# Patient Record
Sex: Female | Born: 1990 | Race: White | Hispanic: No | Marital: Married | Smoking: Never smoker
Health system: Southern US, Community
[De-identification: ages and names within clinical notes are randomized; demographics above are authoritative.]

## PROBLEM LIST (undated history)

## (undated) HISTORY — PX: ABDOMINAL SURGERY: SHX537

---

## 2020-12-08 ENCOUNTER — Other Ambulatory Visit: Payer: 59

## 2020-12-08 ENCOUNTER — Other Ambulatory Visit: Payer: Self-pay

## 2020-12-08 DIAGNOSIS — Z20822 Contact with and (suspected) exposure to covid-19: Secondary | ICD-10-CM

## 2020-12-11 LAB — NOVEL CORONAVIRUS, NAA: SARS-CoV-2, NAA: DETECTED — AB

## 2020-12-22 ENCOUNTER — Other Ambulatory Visit: Payer: Self-pay | Admitting: Physician Assistant

## 2020-12-22 ENCOUNTER — Other Ambulatory Visit (HOSPITAL_COMMUNITY): Payer: Self-pay

## 2020-12-22 ENCOUNTER — Other Ambulatory Visit (HOSPITAL_COMMUNITY): Payer: Self-pay | Admitting: Physician Assistant

## 2020-12-22 DIAGNOSIS — N946 Dysmenorrhea, unspecified: Secondary | ICD-10-CM

## 2020-12-22 DIAGNOSIS — N939 Abnormal uterine and vaginal bleeding, unspecified: Secondary | ICD-10-CM

## 2020-12-22 DIAGNOSIS — N979 Female infertility, unspecified: Secondary | ICD-10-CM

## 2020-12-22 DIAGNOSIS — E289 Ovarian dysfunction, unspecified: Secondary | ICD-10-CM

## 2020-12-25 ENCOUNTER — Ambulatory Visit
Admission: RE | Admit: 2020-12-25 | Discharge: 2020-12-25 | Disposition: A | Discharge status: Home / Self Care | Payer: 59 | Source: Ambulatory Visit | Attending: Pulmonary Disease | Admitting: Pulmonary Disease

## 2020-12-25 ENCOUNTER — Other Ambulatory Visit: Payer: Self-pay

## 2020-12-25 DIAGNOSIS — N939 Abnormal uterine and vaginal bleeding, unspecified: Secondary | ICD-10-CM | POA: Diagnosis present

## 2020-12-25 DIAGNOSIS — E289 Ovarian dysfunction, unspecified: Secondary | ICD-10-CM

## 2020-12-25 DIAGNOSIS — N979 Female infertility, unspecified: Secondary | ICD-10-CM | POA: Diagnosis present

## 2020-12-25 DIAGNOSIS — N946 Dysmenorrhea, unspecified: Secondary | ICD-10-CM | POA: Diagnosis present

## 2021-11-27 IMAGING — US US PELVIS COMPLETE WITH TRANSVAGINAL
1 series · 14 of 25 positions shown · non-contrast
Comparison: None

CLINICAL DATA: Menorrhagia, abnormal vaginal bleeding, ovarian
dysfunction infertility, LMP 12/18/2020

EXAM:
TRANSABDOMINAL AND TRANSVAGINAL ULTRASOUND OF PELVIS
TECHNIQUE: Both transabdominal and transvaginal ultrasound examinations of the
pelvis were performed. Transabdominal technique was performed for
global imaging of the pelvis including uterus, ovaries, adnexal
regions, and pelvic cul-de-sac. It was necessary to proceed with
endovaginal exam following the transabdominal exam to visualize the
endometrium and RIGHT ovary.

[Series 1: us pelvis complete with transvaginal · 0.27mm/px · 94 acquisitions, 14 frames shown]
[im 1/94]
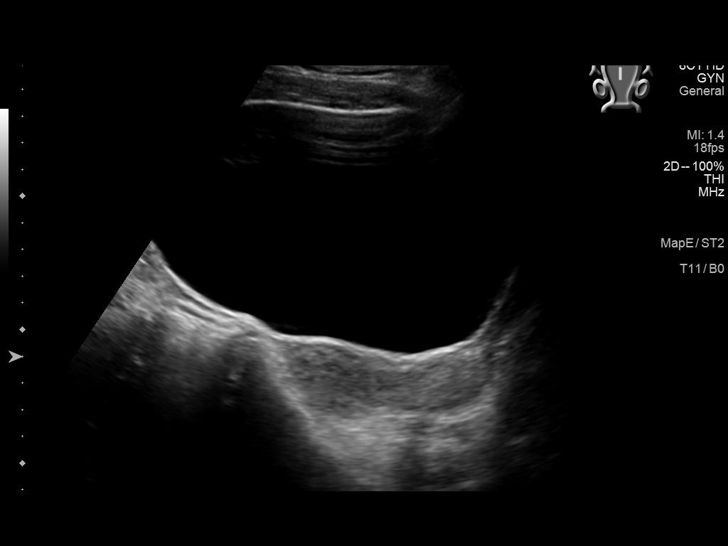
[im 8/94]
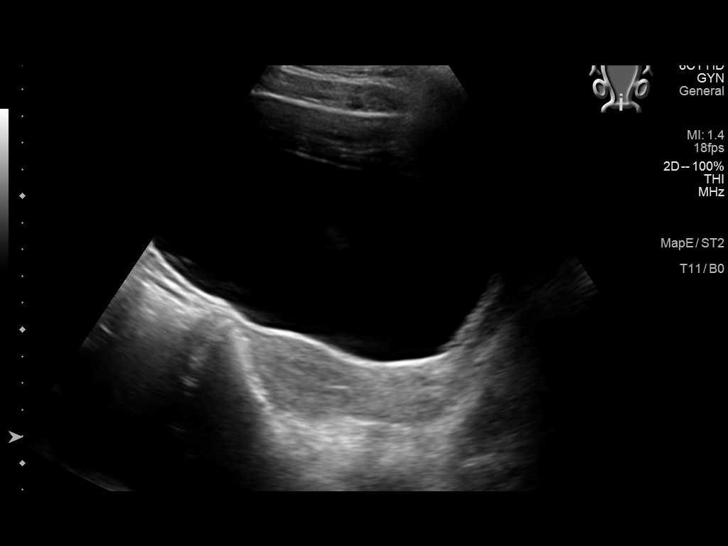
[im 16/94]
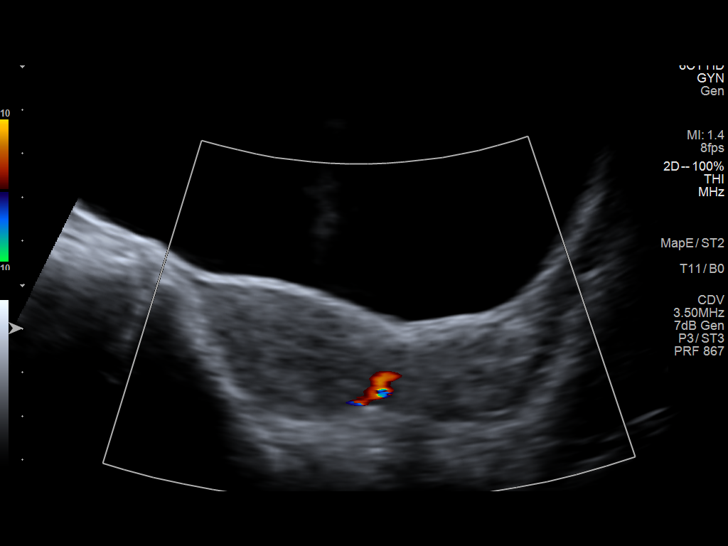
[im 24/94]
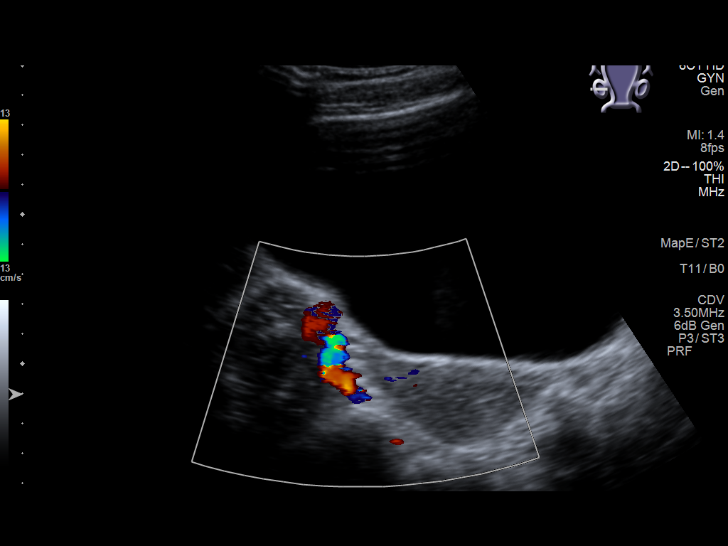
[im 32/94]
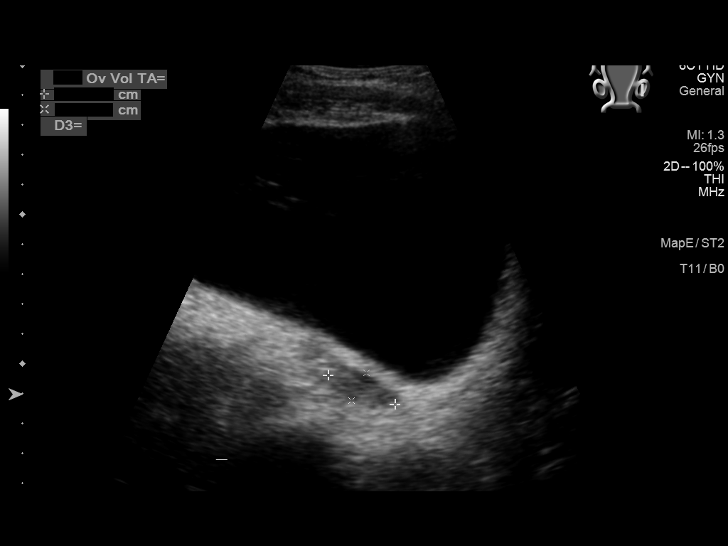
[im 35/94]
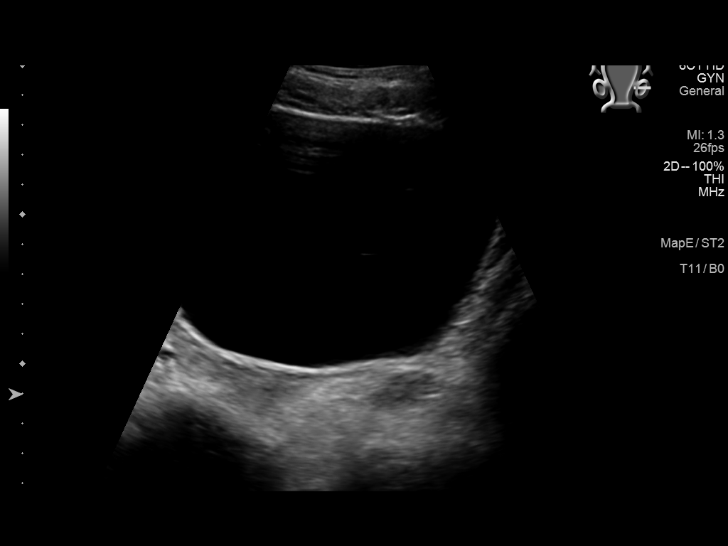
[im 43/94]
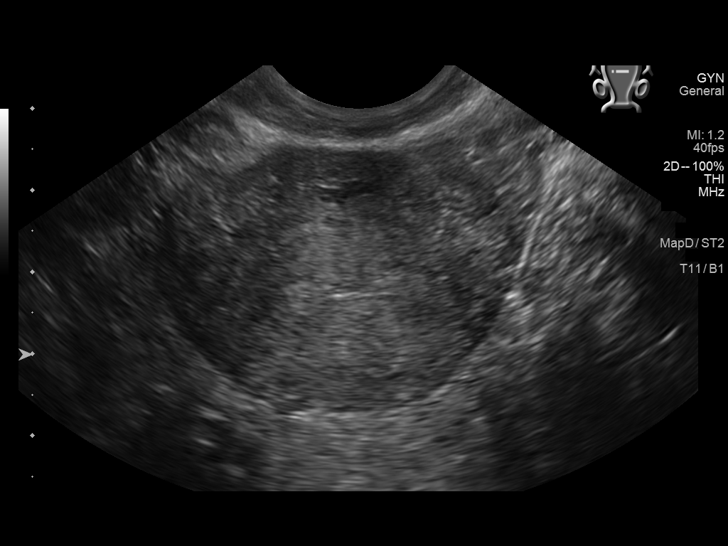
[im 51/94]
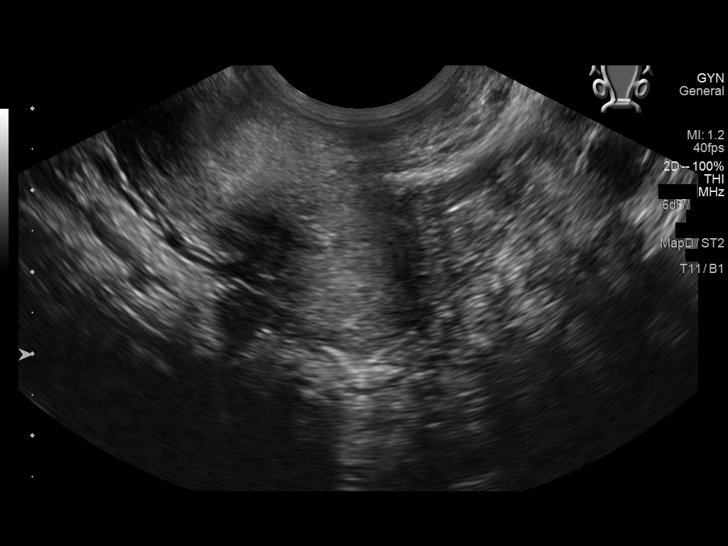
[im 59/94]
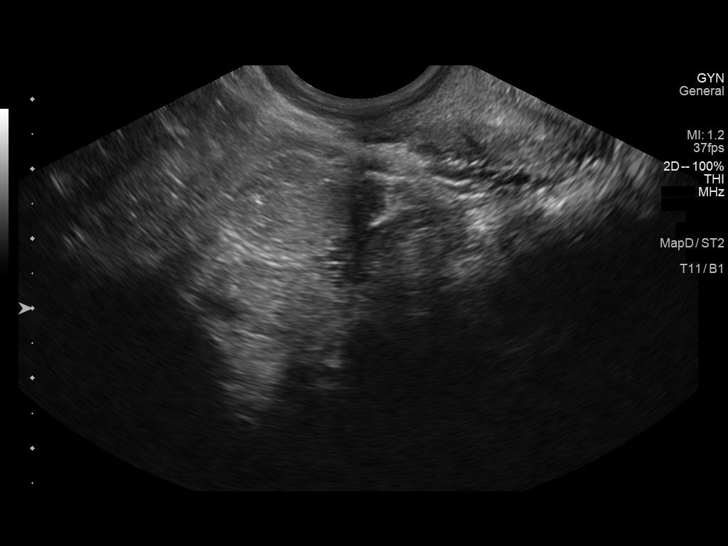
[im 63/94]
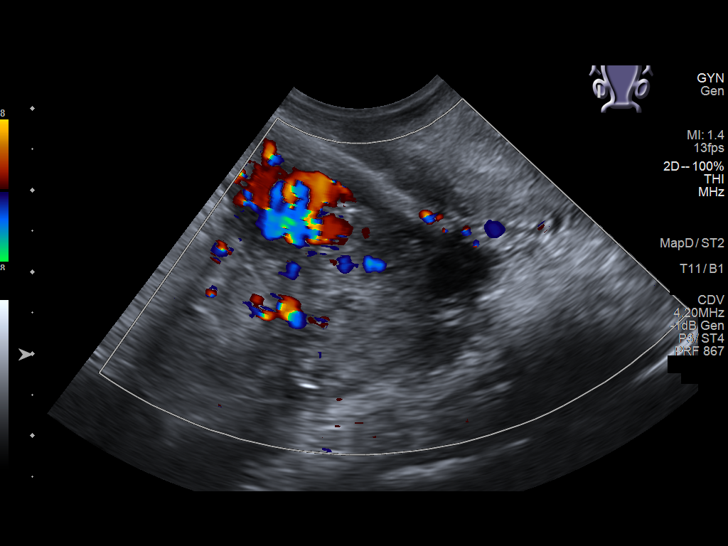
[im 70/94]
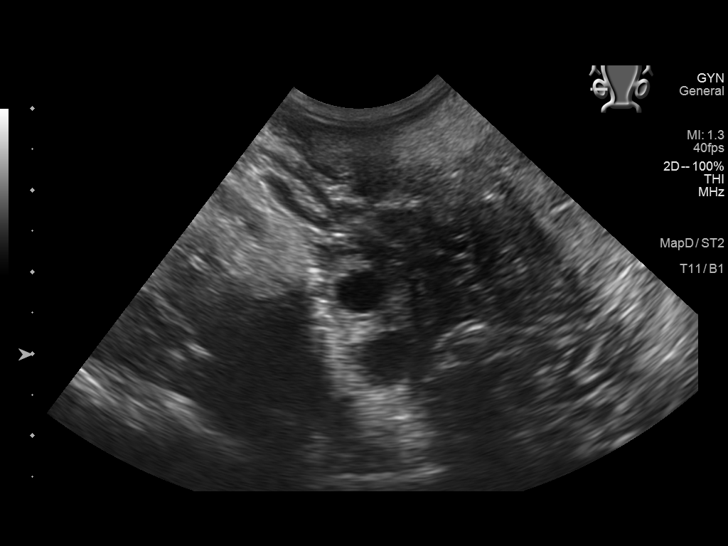
[im 78/94]
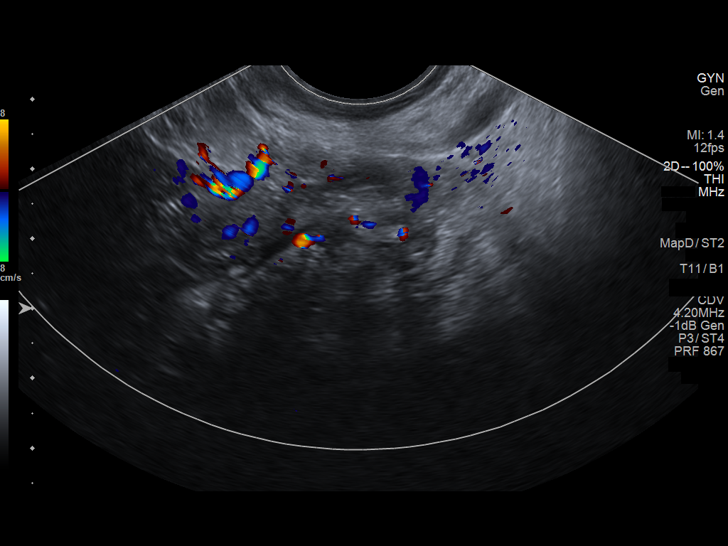
[im 86/94]
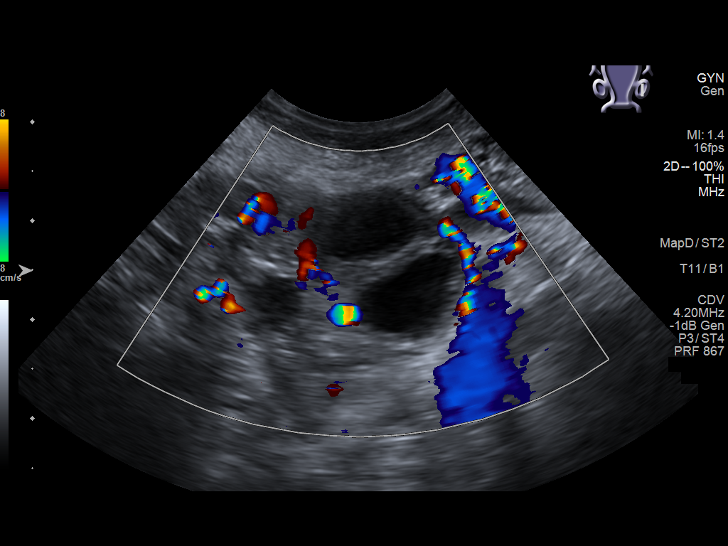
[im 94/94]
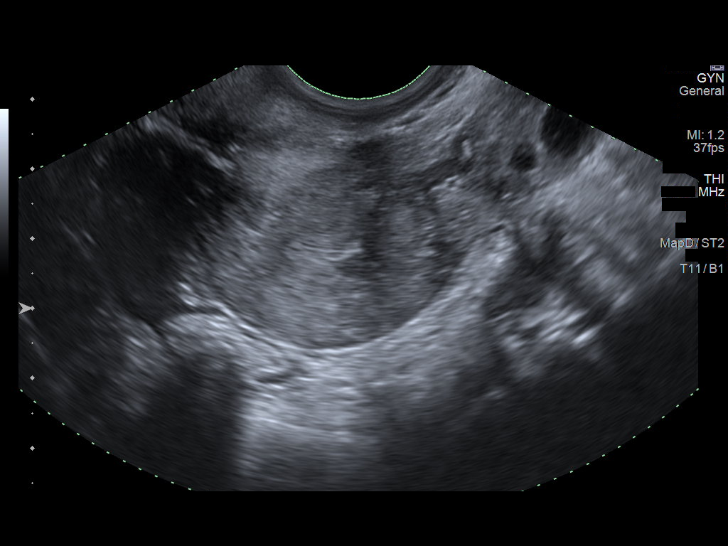

[14 of 25 positions shown; findings below may reference images not displayed]

FINDINGS: Uterus

Measurements: 7.6 x 2.9 x 4.4 cm = volume: 50 mL. Anteverted. Normal
morphology without mass

Endometrium

Thickness: 6 mm.  No endometrial fluid or focal abnormality

Right ovary

Measurements: 2.8 x 1.9 x 2.0 cm = volume: 5.6 mL. Normal morphology
without mass

Left ovary

Measurements: 2.5 x 1.8 x 1.7 cm = volume: 4.0 mL. Normal morphology
without mass

Other findings

No free pelvic fluid.  No adnexal masses.
IMPRESSION: Normal exam.

## 2022-01-19 ENCOUNTER — Encounter: Payer: Self-pay | Admitting: Emergency Medicine

## 2022-01-19 ENCOUNTER — Ambulatory Visit
Admission: EM | Admit: 2022-01-19 | Discharge: 2022-01-19 | Disposition: A | Discharge status: Home / Self Care | Payer: 59 | Attending: Emergency Medicine | Admitting: Emergency Medicine

## 2022-01-19 ENCOUNTER — Other Ambulatory Visit: Payer: Self-pay

## 2022-01-19 DIAGNOSIS — M5431 Sciatica, right side: Secondary | ICD-10-CM

## 2022-01-19 DIAGNOSIS — M549 Dorsalgia, unspecified: Secondary | ICD-10-CM | POA: Diagnosis not present

## 2022-01-19 DIAGNOSIS — R1013 Epigastric pain: Secondary | ICD-10-CM

## 2022-01-19 DIAGNOSIS — R197 Diarrhea, unspecified: Secondary | ICD-10-CM | POA: Diagnosis not present

## 2022-01-19 DIAGNOSIS — R140 Abdominal distension (gaseous): Secondary | ICD-10-CM | POA: Insufficient documentation

## 2022-01-19 LAB — CBC WITH DIFFERENTIAL/PLATELET
Abs Immature Granulocytes: 0.01 10*3/uL (ref 0.00–0.07)
Basophils Absolute: 0.1 10*3/uL (ref 0.0–0.1)
Basophils Relative: 1 %
Eosinophils Absolute: 1.6 10*3/uL — ABNORMAL HIGH (ref 0.0–0.5)
Eosinophils Relative: 15 %
HCT: 40.2 % (ref 36.0–46.0)
Hemoglobin: 13.8 g/dL (ref 12.0–15.0)
Immature Granulocytes: 0 %
Lymphocytes Relative: 27 %
Lymphs Abs: 2.8 10*3/uL (ref 0.7–4.0)
MCH: 30.5 pg (ref 26.0–34.0)
MCHC: 34.3 g/dL (ref 30.0–36.0)
MCV: 88.7 fL (ref 80.0–100.0)
Monocytes Absolute: 0.6 10*3/uL (ref 0.1–1.0)
Monocytes Relative: 6 %
Neutro Abs: 5.6 10*3/uL (ref 1.7–7.7)
Neutrophils Relative %: 51 %
Platelets: 255 10*3/uL (ref 150–400)
RBC: 4.53 MIL/uL (ref 3.87–5.11)
RDW: 11.8 % (ref 11.5–15.5)
WBC: 10.6 10*3/uL — ABNORMAL HIGH (ref 4.0–10.5)
nRBC: 0 % (ref 0.0–0.2)

## 2022-01-19 LAB — COMPREHENSIVE METABOLIC PANEL
ALT: 20 U/L (ref 0–44)
AST: 27 U/L (ref 15–41)
Albumin: 4.3 g/dL (ref 3.5–5.0)
Alkaline Phosphatase: 57 U/L (ref 38–126)
Anion gap: 7 (ref 5–15)
BUN: 11 mg/dL (ref 6–20)
CO2: 26 mmol/L (ref 22–32)
Calcium: 9 mg/dL (ref 8.9–10.3)
Chloride: 101 mmol/L (ref 98–111)
Creatinine, Ser: 0.63 mg/dL (ref 0.44–1.00)
GFR, Estimated: >60 mL/min (ref >60)
Glucose, Bld: 85 mg/dL (ref 70–99)
Potassium: 4.2 mmol/L (ref 3.5–5.1)
Sodium: 134 mmol/L — ABNORMAL LOW (ref 135–145)
Total Bilirubin: 1 mg/dL (ref 0.3–1.2)
Total Protein: 7.1 g/dL (ref 6.5–8.1)

## 2022-01-19 LAB — LIPASE, BLOOD: Lipase: 34 U/L (ref 11–51)

## 2022-01-19 MED ORDER — LIDOCAINE VISCOUS HCL 2 % MT SOLN
15.0000 mL | Freq: Once | OROMUCOSAL | Status: AC
  Administered 2022-01-19: 15 mL via ORAL

## 2022-01-19 MED ORDER — OMEPRAZOLE 20 MG PO CPDR: 20.0000 mg | DELAYED_RELEASE_CAPSULE | Freq: Every day | ORAL | 2 refills | Status: AC

## 2022-01-19 MED ORDER — ALUM & MAG HYDROXIDE-SIMETH 200-200-20 MG/5ML PO SUSP
30.0000 mL | Freq: Once | ORAL | Status: AC
  Administered 2022-01-19: 30 mL via ORAL

## 2022-01-19 MED ORDER — METHYLPREDNISOLONE 4 MG PO TBPK: ORAL_TABLET | ORAL | 0 refills | Status: AC

## 2022-01-19 NOTE — ED Provider Notes (Signed)
[facility]    CSN: [id number] Arrival date & time: 01/19/22  1146      History   Chief Complaint Chief Complaint  Patient presents with   Abdominal Pain    HPI Chelsea Foley is a 31 y.o. female.   HPI  31 year old female here for evaluation of abdominal pain.  Patient reports that she has been experiencing epigastric abdominal pain for the last 4 to 5 weeks that is associated with pressure in her abdomen that she describes as a feeling of a balloon inflating and deflating that radiates through to her back.  She also reports that she has been having diarrhea stools for the last 4 weeks as well.  The pain has increased over the last 2 weeks and more significantly in the last couple of days prompting her to come in for evaluation.  She states food may improve her pain symptoms but the pain returns short while later.  She says she eats gluten-free and largely dairy free other than the occasional high-quality goat cheese.  She denies any fever, belching, nausea or vomiting, burning in her esophagus, or sour taste in her mouth.  Additionally, she is also complaining of pain in her low back that radiates through her right buttock into the outside of her right thigh to approximately mid thigh.  History reviewed. No pertinent past medical history.  There are no problems to display for this patient.   Past Surgical History:  Procedure Laterality Date   ABDOMINAL SURGERY      OB History   No obstetric history on file.      Home Medications    Prior to Admission medications   Medication Sig Start Date End Date Taking? Authorizing Provider  cabergoline (DOSTINEX) 0.5 MG tablet Take by mouth. 11/11/21  Yes [provider]  Cholecalciferol 125 MCG (5000 UT) capsule Take by mouth. 09/06/21  Yes [provider]  Desiccated Beef Liver POWD by Does not apply route.   Yes [provider]  methylPREDNISolone (MEDROL DOSEPAK) 4 MG TBPK tablet Take  according to the package insert. 01/19/22  Yes Becky Augusta, NP  omeprazole (PRILOSEC) 20 MG capsule Take 1 capsule (20 mg total) by mouth daily. 01/19/22  Yes Becky Augusta, NP  Zinc 25 MG TABS Take by mouth.   Yes [provider]    Family History History reviewed. No pertinent family history.  Social History Social History   Tobacco Use   Smoking status: Never   Smokeless tobacco: Never  Vaping Use   Vaping Use: Never used  Substance Use Topics   Alcohol use: Not Currently   Drug use: Never     Allergies   Patient has no known allergies.   Review of Systems Review of Systems  Constitutional:  Negative for activity change, appetite change and fever.  Gastrointestinal:  Positive for abdominal distention, abdominal pain and diarrhea. Negative for blood in stool, nausea and vomiting.  Musculoskeletal:  Positive for back pain.  Skin:  Negative for rash.  Hematological: Negative.     Physical Exam Triage Vital Signs ED Triage Vitals  Enc Vitals Group     BP 01/19/22 1200 112/84     Pulse Rate 01/19/22 1200 81     Resp 01/19/22 1200 14     Temp 01/19/22 1200 98.3 F (36.8 C)     Temp Source 01/19/22 1200 Oral     SpO2 01/19/22 1200 100 %     Weight 01/19/22 1156 105 lb (47.6  kg)     Height 01/19/22 1156 5\' 1"  (1.549 m)     Head Circumference --      Peak Flow --      Pain Score 01/19/22 1156 9     Pain Loc --      Pain Edu? --      Excl. in GC? --    No data found.  Updated Vital Signs BP 112/84 (BP Location: Left Arm)    Pulse 81    Temp 98.3 F (36.8 C) (Oral)    Resp 14    Ht 5\' 1"  (1.549 m)    Wt 105 lb (47.6 kg)    LMP 01/09/2022 (Approximate)    SpO2 100%    BMI 19.84 kg/m   Visual Acuity Right Eye Distance:   Left Eye Distance:   Bilateral Distance:    Right Eye Near:   Left Eye Near:    Bilateral Near:     Physical Exam Vitals and nursing note reviewed.  Constitutional:      Appearance: Normal appearance. She is not ill-appearing.   HENT:     Head: Normocephalic and atraumatic.  Cardiovascular:     Rate and Rhythm: Normal rate and regular rhythm.     Pulses: Normal pulses.     Heart sounds: Normal heart sounds. No murmur heard.   No friction rub. No gallop.  Pulmonary:     Effort: Pulmonary effort is normal.     Breath sounds: Normal breath sounds. No wheezing, rhonchi or rales.  Abdominal:     General: Abdomen is flat. Bowel sounds are normal.     Palpations: Abdomen is soft.     Tenderness: There is abdominal tenderness. There is no guarding or rebound.  Musculoskeletal:        General: Tenderness present. No swelling or deformity.  Skin:    General: Skin is warm and dry.     Capillary Refill: Capillary refill takes less than 2 seconds.     Findings: No erythema or rash.  Neurological:     General: No focal deficit present.     Mental Status: She is alert and oriented to person, place, and time.  Psychiatric:        Mood and Affect: Mood normal.        Behavior: Behavior normal.        Thought Content: Thought content normal.        Judgment: Judgment normal.     UC Treatments / Results  Labs (all labs ordered are listed, but only abnormal results are displayed) Labs Reviewed  CBC WITH DIFFERENTIAL/PLATELET - Abnormal; Notable for the following components:      Result Value   WBC 10.6 (*)    Eosinophils Absolute 1.6 (*)    All other components within normal limits  COMPREHENSIVE METABOLIC PANEL - Abnormal; Notable for the following components:   Sodium 134 (*)    All other components within normal limits  LIPASE, BLOOD  H PYLORI, IGM, IGG, IGA AB    EKG   Radiology No results found.  Procedures Procedures (including critical care time)  Medications Ordered in UC Medications  alum & mag hydroxide-simeth (MAALOX/MYLANTA) 200-200-20 MG/5ML suspension 30 mL (30 mLs Oral Given 01/19/22 1235)    And  lidocaine (XYLOCAINE) 2 % viscous mouth solution 15 mL (15 mLs Oral Given 01/19/22 1236)     Initial Impression / Assessment and Plan / UC Course  I have reviewed the triage vital signs and  the nursing notes.  Pertinent labs & imaging results that were available during my care of the patient were reviewed by me and considered in my medical decision making (see chart for details).  Patient is a nontoxic-appearing 31 year old female here for evaluation of multiple complaints.  Her primary complaint is 4 weeks of epigastric abdominal pain radiating through to her back that has increased over the last 2 weeks.  She reports that increased more significantly over the past couple days prompting her visit today.  Patient denies any fever, nausea, vomiting, or belching.  She states that food does not make the pain worse but actually makes the pain somewhat better but then the pain returns a little while later.  She is also been experiencing diarrhea stools over the last 4 weeks but denies any blood in her stool.  She says some of her stools may have been dark but she is unsure.  No definitive blood in the bowl or when she wipes.  She states that she eats gluten-free and largely dairy free.  No burning in her esophagus or sour taste in her mouth.  No history of GERD.  Patient is also complaining of low back pain that radiates to her right buttock and on the outside of her right leg.  On exam patient has a benign cardiopulmonary exam with S1-S2 heart sounds that are regular rate and rhythm.  Lung sounds are clear to auscultation all fields.  Abdomen is soft, flat, with positive bowel sounds in all 4 quadrants.  Patient does have moderate epigastric and right upper quadrant tenderness.  She also has some left upper quadrant tenderness, left lower quadrant tenderness, and right lower quadrant tenderness.  No significant tenderness is in the epigastric region.  No guarding or rebound.  Patient does have a positive straight leg raise on the right and a contralateral straight leg raise on the left.  She is also  complaining of some pain in the right hip with passive range of motion and flexion of the hip to 90 degrees with also internal and external rotation.  These are not similar on the left-hand side.  I suspect that patient has sciatica which explains her back and right leg pain.  Patient did have laparoscopic surgery for endometriosis 5 months ago which may explain some of her abdominal tenderness as she had multiple lesions on her intestines.  She has had no significant bloating, nausea, or vomiting.  She is still having stools that is diarrhea.  Her differential includes cholelithiasis, pancreatitis, GERD, H. pylori, peptic ulcer disease, and possible SIBO.  We will check CBC, CMP, lipase here and I will give her a GI cocktail to see if that helps with her GI pain.  I advised the patient that we do not have ability to do any imaging here in urgent care and if her lab work is unremarkable she should follow-up with her PCP for outpatient imaging unless her symptoms become worse.  We will also check H. pylori antibodies.  CBC is largely unremarkable other than a white count of 10.6 with is just outside the normal parameters.  CMP shows mild hyponatremia with a sodium of 134.  Remainder of electrolytes, renal function, and transaminases are unremarkable.  Lipase is 34.  H. pylori antibodies are pending.  Patient reports that she had significant improvement of her pain after the GI cocktail.  This is reassuring that this is most likely either GERD, SIBO, or possibly H. pylori.  I advised the patient that  I am going to put her on a PPI and she should start taking that at night.  I will prescribe Prilosec 20 mg nightly.  I have also encouraged her to start taking probiotics such as Saccharomyces Boulardii 3 times a day as this will help if there is a bacterial overgrowth or H. pylori infection.  I will also give her a list of food choices to help with GI symptoms.  She should contact her PCP for a [facility] as [facility] who her primary care provider is through.  I will also start the patient on Medrol Dosepak and home physical therapy for her sciatica.   Final Clinical Impressions(s) / UC Diagnoses   Final diagnoses:  Sciatica of right side  Epigastric pain     Discharge Instructions      Take the prednisone according to the package instructions.  You will take it each morning with breakfast.  Make sure that you are always taking it with food.  Follow the rehabilitation exercises in your discharge paperwork to help you with your sciatic pain.  Start taking the Prilosec 20 mg at bedtime each night.  This will cut down the amount of stomach acid your stomach is producing and should help with your epigastric abdominal pain.  Follow the food choice selection guide to make modifications to your diet and see if this improves your discomfort as well.  Contact your PCP on Monday for referral [facility] further evaluation of your symptoms.  If you develop any sharp abdominal pain, fever, vomit blood, or pass blood in her stool you need to be evaluated in the emergency department.     ED Prescriptions     Medication Sig Dispense Auth. Provider   omeprazole (PRILOSEC) 20 MG capsule Take 1 capsule (20 mg total) by mouth daily. 30 capsule Becky Augustayan, Sayge Salvato, NP   methylPREDNISolone (MEDROL DOSEPAK) 4 MG TBPK tablet Take according to the package insert. 1 each Becky Augustayan, Smaran Gaus, NP      PDMP not reviewed this encounter.   Becky Augustayan, Albaro Deviney, NP 01/19/22 1319

## 2022-01-19 NOTE — Discharge Instructions (Addendum)
Take the prednisone according to the package instructions.  You will take it each morning with breakfast.  Make sure that you are always taking it with food.  Follow the rehabilitation exercises in your discharge paperwork to help you with your sciatic pain.  Start taking the Prilosec 20 mg at bedtime each night.  This will cut down the amount of stomach acid your stomach is producing and should help with your epigastric abdominal pain.  Follow the food choice selection guide to make modifications to your diet and see if this improves your discomfort as well.  Start taking a probiotic such as Saccharomyces Boulardii to help with alterations in your gut microbiome which may be contributing to your epigastric pain and/or diarrhea.  Contact your PCP on Monday for referral to GI medicine at [facility] for further evaluation of your symptoms.  If you develop any sharp abdominal pain, fever, vomit blood, or pass blood in her stool you need to be evaluated in the emergency department.

## 2022-01-19 NOTE — ED Triage Notes (Signed)
Patient c/o mid upper abdominal pain that started 3-4 weeks.  Patient reports some lower back pain also.  Patient reports history of endometriosis.  Patient denies N/V.  Patient also reports loose stools.  Patient denies any urinary symptoms.

## 2022-01-22 LAB — H PYLORI, IGM, IGG, IGA AB
H Pylori IgG: 0.1 Index Value (ref 0.00–0.79)
H. Pylogi, Iga Abs: <9 units (ref 0.0–8.9)
H. Pylogi, Igm Abs: <9 units (ref 0.0–8.9)
